# Patient Record
Sex: Male | Born: 1970 | Race: White | Hispanic: No | Marital: Married | Smoking: Never smoker
Health system: Southern US, Community
[De-identification: ages and names within clinical notes are randomized; demographics above are authoritative.]

## PROBLEM LIST (undated history)

## (undated) HISTORY — PX: THYROID SURGERY: SHX805

---

## 2013-12-08 DIAGNOSIS — J309 Allergic rhinitis, unspecified: Secondary | ICD-10-CM | POA: Insufficient documentation

## 2015-09-28 ENCOUNTER — Ambulatory Visit
Admission: EM | Admit: 2015-09-28 | Discharge: 2015-09-28 | Disposition: A | Discharge status: Home / Self Care | Payer: BLUE CROSS/BLUE SHIELD | Attending: Family Medicine | Admitting: Family Medicine

## 2015-09-28 ENCOUNTER — Encounter: Payer: Self-pay | Admitting: Emergency Medicine

## 2015-09-28 DIAGNOSIS — R21 Rash and other nonspecific skin eruption: Secondary | ICD-10-CM

## 2015-09-28 MED ORDER — CETIRIZINE HCL 10 MG PO TABS: 10.0000 mg | ORAL_TABLET | Freq: Every day | ORAL | Status: DC

## 2015-09-28 MED ORDER — RANITIDINE HCL 150 MG PO CAPS: 150.0000 mg | ORAL_CAPSULE | Freq: Two times a day (BID) | ORAL | Status: DC | PRN

## 2015-09-28 MED ORDER — PREDNISONE 10 MG (21) PO TBPK: ORAL_TABLET | ORAL | Status: DC

## 2015-09-28 MED ORDER — LORATADINE 10 MG PO TABS: 10.0000 mg | ORAL_TABLET | Freq: Every day | ORAL | Status: DC

## 2015-09-28 MED ORDER — METHYLPREDNISOLONE SODIUM SUCC 125 MG IJ SOLR
125.0000 mg | Freq: Once | INTRAMUSCULAR | Status: AC
  Administered 2015-09-28: 125 mg via INTRAMUSCULAR

## 2015-09-28 NOTE — Discharge Instructions (Signed)
Contact Dermatitis Dermatitis is redness, soreness, and swelling (inflammation) of the skin. Contact dermatitis is a reaction to certain substances that touch the skin. You either touched something that irritated your skin, or you have allergies to something you touched.  HOME CARE  Skin Care  Moisturize your skin as needed.  Apply cool compresses to the affected areas.   Try taking a bath with:   Epsom salts. Follow the instructions on the package. You can get these at a pharmacy or grocery store.   Baking soda. Pour a small amount into the bath as told by your doctor.   Colloidal oatmeal. Follow the instructions on the package. You can get this at a pharmacy or grocery store.   Try applying baking soda paste to your skin. Stir water into baking soda until it looks like paste.  Do not scratch your skin.   Bathe less often.  Bathe in lukewarm water. Avoid using hot water.  Medicines  Take or apply over-the-counter and prescription medicines only as told by your doctor.   If you were prescribed an antibiotic medicine, take or apply your antibiotic as told by your doctor. Do not stop taking the antibiotic even if your condition starts to get better. General Instructions  Keep all follow-up visits as told by your doctor. This is important.   Avoid the substance that caused your reaction. If you do not know what caused it, keep a journal to try to track what caused it. Write down:   What you eat.   What cosmetic products you use.   What you drink.   What you wear in the affected area. This includes jewelry.   If you were given a bandage (dressing), take care of it as told by your doctor. This includes when to change and remove it.  GET HELP IF:   You do not get better with treatment.   Your condition gets worse.   You have signs of infection such as:  Swelling.  Tenderness.  Redness.  Soreness.  Warmth.   You have a fever.   You have new  symptoms.  GET HELP RIGHT AWAY IF:   You have a very bad headache.  You have neck pain.  Your neck is stiff.   You throw up (vomit).   You feel very sleepy.   You see red streaks coming from the affected area.   Your bone or joint underneath the affected area becomes painful after the skin has healed.   The affected area turns darker.   You have trouble breathing.    This information is not intended to replace advice given to you by your health care provider. Make sure you discuss any questions you have with your health care provider.   Document Released: 01/13/2009 Document Revised: 12/07/2014 Document Reviewed: 08/03/2014 Elsevier Interactive Patient Education 2016 Elsevier Inc. Rash A rash is a change in the color or texture of the skin. There are many different types of rashes. You may have other problems that accompany your rash. CAUSES   Infections.  Allergic reactions. This can include allergies to pets or foods.  Certain medicines.  Exposure to certain chemicals, soaps, or cosmetics.  Heat.  Exposure to poisonous plants.  Tumors, both cancerous and noncancerous. SYMPTOMS   Redness.  Scaly skin.  Itchy skin.  Dry or cracked skin.  Bumps.  Blisters.  Pain. DIAGNOSIS  Your caregiver may do a physical exam to determine what type of rash you have. A skin sample (biopsy)  may be taken and examined under a microscope. TREATMENT  Treatment depends on the type of rash you have. Your caregiver may prescribe certain medicines. For serious conditions, you may need to see a skin doctor (dermatologist). HOME CARE INSTRUCTIONS   Avoid the substance that caused your rash.  Do not scratch your rash. This can cause infection.  You may take cool baths to help stop itching.  Only take over-the-counter or prescription medicines as directed by your caregiver.  Keep all follow-up appointments as directed by your caregiver. SEEK IMMEDIATE MEDICAL CARE  IF:  You have increasing pain, swelling, or redness.  You have a fever.  You have new or severe symptoms.  You have body aches, diarrhea, or vomiting.  Your rash is not better after 3 days. MAKE SURE YOU:  Understand these instructions.  Will watch your condition.  Will get help right away if you are not doing well or get worse.   This information is not intended to replace advice given to you by your health care provider. Make sure you discuss any questions you have with your health care provider.   Document Released: 03/08/2002 Document Revised: 04/08/2014 Document Reviewed: 08/03/2014 Elsevier Interactive Patient Education [employer] Elsevier Inc.

## 2015-09-28 NOTE — ED Provider Notes (Signed)
CSN: [id number]     Arrival date & time 09/28/15  0805 History   First MD Initiated Contact with Patient 09/28/15 [phone]    Nurses notes were reviewed. Chief Complaint  Patient presents with  . Rash   Patient with a rash over most of his body. States started in his legs [facility] does not know how the rash started. He denies any exposure to any vegetation states [place] leg. He states that this itches but he has not scratched his legs but he has her discretion back last night. Started lower part of his legs and spread from the lower part of his legs upper thighs and his back as well. He's had poison ivy and poison oak before. He went to the control clinic on Tuesday since it started on Monday was brought her cortisone cream which does not help.  He does not smoke he's had thyroid surgery but denies any other medical problems. No pertinent family medical history pertinent to today's visit.  (Consider location/radiation/quality/duration/timing/severity/associated sxs/prior Treatment) Patient is a 45 y.o. male presenting with rash. The history is provided by the patient. No language interpreter was used.  Rash Location:  Full body Quality: itchiness and redness   Severity:  Moderate Duration:  5 days Timing:  Constant Progression:  Worsening Chronicity:  New Context: not animal contact, not chemical exposure, not plant contact and not sun exposure   Relieved by:  Nothing Ineffective treatments:  Topical steroids Associated symptoms: no abdominal pain, no fatigue, no shortness of breath, no sore throat, no throat swelling and not wheezing     History reviewed. No pertinent past medical history. Past Surgical History  Procedure Laterality Date  . Thyroid surgery     History reviewed. No pertinent family history. Social History  Substance Use Topics  . Smoking status: Never Smoker   . Smokeless tobacco: Never Used  . Alcohol Use: Yes    Review of Systems    Constitutional: Negative for fatigue.  HENT: Positive for mouth sores. Negative for facial swelling, sinus pressure, sneezing and sore throat.   Respiratory: Negative for choking, shortness of breath and wheezing.   Gastrointestinal: Negative for abdominal pain.  Skin: Positive for rash.  All other systems reviewed and are negative.   Allergies  Review of patient's allergies indicates no known allergies.  Home Medications   Prior to Admission medications   Medication Sig Start Date End Date Taking? Authorizing Provider  Multiple Vitamin (MULTIVITAMIN) tablet Take 1 tablet by mouth daily.   Yes Historical Provider, MD  omega-3 acid ethyl esters (LOVAZA) 1 g capsule Take 1 g by mouth daily.   Yes Historical Provider, MD  cetirizine (ZYRTEC) 10 MG tablet Take 1 tablet (10 mg total) by mouth daily. If needed at night for itching not relieved by Claritin in the morning. 09/28/15   Hassan RowanEugene Terrance Lanahan, MD  loratadine (CLARITIN) 10 MG tablet Take 1 tablet (10 mg total) by mouth daily. Take 1 tablet in the morning. As needed for itching. 09/28/15   Hassan RowanEugene Ariza Evans, MD  predniSONE (STERAPRED UNI-PAK 21 TAB) 10 MG (21) TBPK tablet 6 tabs day 1 and 2, 5 tabs day 3 and 4, 4 tabs day 5 and 6, 3 tabs day 7 and 8, 2 tabs day 9 and 10, 1 tab day 11 and 12. Take orally 09/28/15   Hassan RowanEugene Alexyss Balzarini, MD  ranitidine (ZANTAC) 150 MG capsule Take 1 capsule (150 mg total) by mouth 2 (two) times daily as  needed for heartburn. 09/28/15   Hassan RowanEugene Chandra Feger, MD   Meds Ordered and Administered this Visit   Medications  methylPREDNISolone sodium succinate (SOLU-MEDROL) 125 mg/2 mL injection 125 mg (125 mg Intramuscular Given 09/28/15 0849)    BP 108/69 mmHg  Pulse 42  Temp(Src) 97.7 F (36.5 C) (Oral)  Resp 16  Ht 6\' 1"  (1.854 m)  Wt 170 lb (77.111 kg)  BMI 22.43 kg/m2  SpO2 100% No data found.   Physical Exam  Constitutional: He is oriented to person, place, and time. He appears well-developed and well-nourished.  HENT:  Head:  Normocephalic and atraumatic.  Eyes: Conjunctivae are normal. Pupils are equal, round, and reactive to light.  Neck: Normal range of motion.  Pulmonary/Chest: Effort normal.  Musculoskeletal: Normal range of motion.  Neurological: He is alert and oriented to person, place, and time.  Skin: Rash noted.     He states this is basically gone from both lower extremities up into his upper back. Reports this also includes his buttocks which I did not see today. While he states is only been scratching his back the entire rash appears to be a linear type fashion as if he's had excoriations on his arms and his legs due to scratching. This rash is almost a vasculitis and that there is blanching overweight he's been scratching so much.  Psychiatric: He has a normal mood and affect.  Vitals reviewed.   ED Course  Procedures (including critical care time)  Labs Review Labs Reviewed - No data to display  Imaging Review No results found.   Visual Acuity Review  Right Eye Distance:   Left Eye Distance:   Bilateral Distance:    Right Eye Near:   Left Eye Near:    Bilateral Near:         MDM   1. Rash of unknown cause   2. Rash and nonspecific skin eruption    Not clear exactly with the etiology of this rash. The Jewel BaizeCraven be a symptom of a contact dermatitis some type of allergic reaction and be very atypical for scabies as well with amount of redness and not seeing any of the burrows typical for scabies. Will treat aggressively for some type of contact dermatitis will give an injection of Solu-Medrol 125 total days of prednisone as Claritin 10 mg daily and Zantac 150 twice a day 1 if the itching persist may need him 10 mg Zyrtec at night. Work note given for today expect with his 2419 better he may need to see a dermatologist for possible skin biopsy.     Note: This dictation was prepared with Dragon dictation along with smaller phrase technology. Any transcriptional errors that result from  this process are unintentional.    Hassan RowanEugene Aliscia Clayton, MD 09/28/15 [id number]

## 2015-09-28 NOTE — ED Notes (Signed)
Patient c/o itchy rash that started on Monday on his legs and now has started to spread to his thigh and back.  Patient [facility] and was given a cream.

## 2015-10-20 ENCOUNTER — Ambulatory Visit
Admission: RE | Admit: 2015-10-20 | Discharge: 2015-10-20 | Disposition: A | Discharge status: Home / Self Care | Payer: BLUE CROSS/BLUE SHIELD | Source: Ambulatory Visit | Attending: Family Medicine | Admitting: Family Medicine

## 2015-10-20 ENCOUNTER — Other Ambulatory Visit: Payer: Self-pay | Admitting: Family Medicine

## 2015-10-20 DIAGNOSIS — N433 Hydrocele, unspecified: Secondary | ICD-10-CM | POA: Diagnosis not present

## 2015-10-20 DIAGNOSIS — N50812 Left testicular pain: Secondary | ICD-10-CM

## 2018-11-24 DIAGNOSIS — R1031 Right lower quadrant pain: Secondary | ICD-10-CM | POA: Insufficient documentation

## 2019-01-07 ENCOUNTER — Other Ambulatory Visit: Payer: Self-pay | Admitting: Surgery

## 2019-01-07 DIAGNOSIS — R1031 Right lower quadrant pain: Secondary | ICD-10-CM

## 2019-01-19 ENCOUNTER — Other Ambulatory Visit: Payer: Self-pay

## 2019-01-19 ENCOUNTER — Ambulatory Visit
Admission: RE | Admit: 2019-01-19 | Discharge: 2019-01-19 | Disposition: A | Discharge status: Home / Self Care | Payer: BC Managed Care – PPO | Source: Ambulatory Visit | Attending: Surgery | Admitting: Surgery

## 2019-01-19 DIAGNOSIS — R1031 Right lower quadrant pain: Secondary | ICD-10-CM | POA: Insufficient documentation

## 2019-01-19 MED ORDER — GADOBUTROL 1 MMOL/ML IV SOLN
7.0000 mL | Freq: Once | INTRAVENOUS | Status: AC | PRN
  Administered 2019-01-19: 7 mL via INTRAVENOUS

## 2020-07-08 IMAGING — MR MR PELVIS WO/W CM
12 of 13 series · 43 of 48 positions shown · IV contrast (gadavist)
Comparison: None.

CLINICAL DATA: Palpable right groin mass x2 months, possible injury
during rope course

EXAM:
MRI PELVIS WITHOUT AND WITH CONTRAST
TECHNIQUE: Multiplanar multisequence MR imaging of the pelvis was performed
both before and after administration of intravenous contrast.
CONTRAST:  7mL GADAVIST GADOBUTROL 1 MMOL/ML IV SOLN

[Series 2: T2 · coronal · 5.0mm · 1.41mm/px · 5 of 32 slices shown]
[im 1/32]
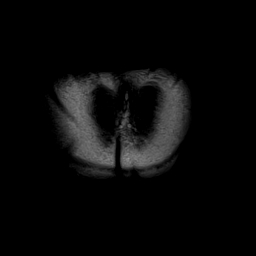
[im 8/32]
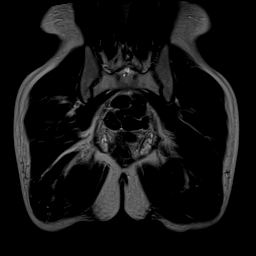
[im 16/32]
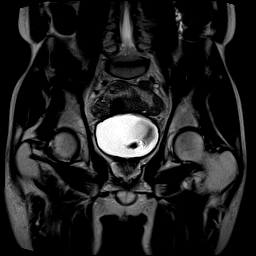
[im 24/32]
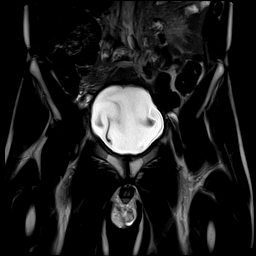
[im 32/32]
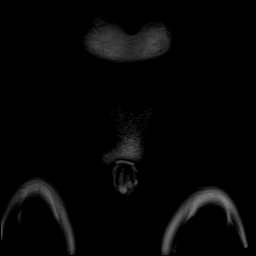

[Series 3: STIR · coronal · 4.0mm · 1.04mm/px · 5 of 40 slices shown (1 of 2)]
[im 1/40]
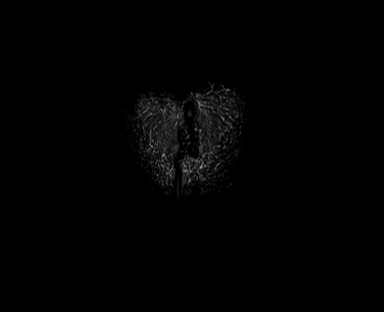
[im 10/40]
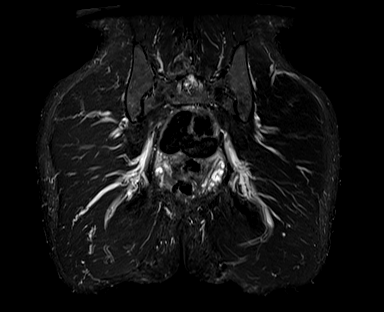
[im 20/40]
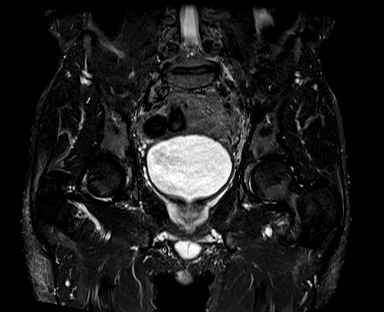
[im 30/40]
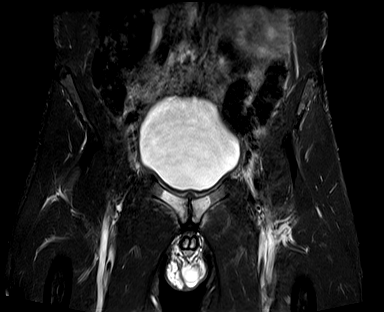
[im 40/40]
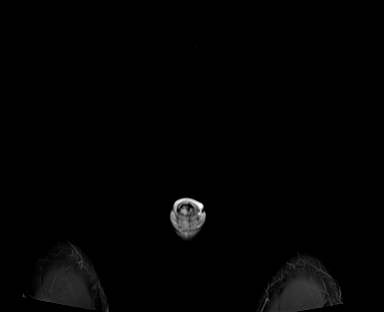

[Series 4: T1 · coronal · 4.0mm · 1.04mm/px · 5 of 40 slices shown (1 of 2)]
[im 1/40]
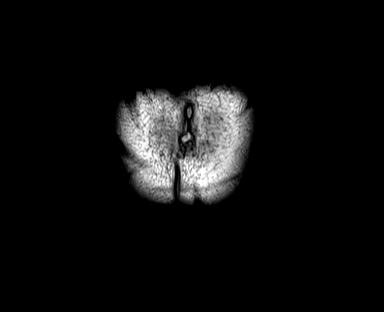
[im 10/40]
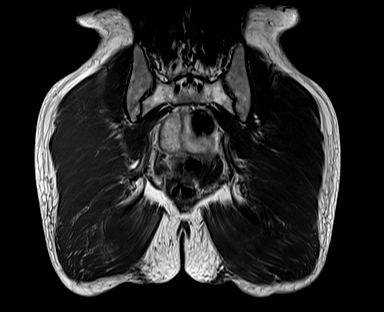
[im 20/40]
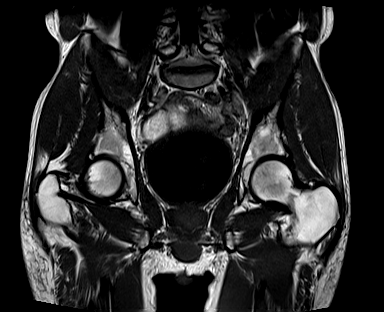
[im 30/40]
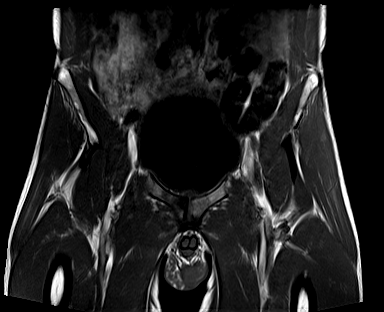
[im 40/40]
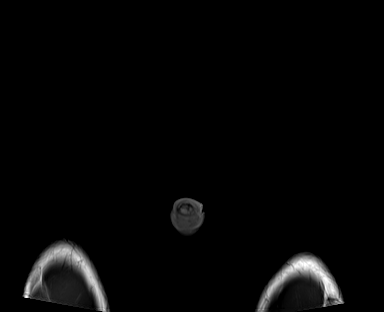

[Series 5: T2 fat-sat · axial · 4.0mm · 0.68mm/px · z∈[-142,+103]mm · 6 of 50 slices shown (1 of 2)]
[im 1/50]
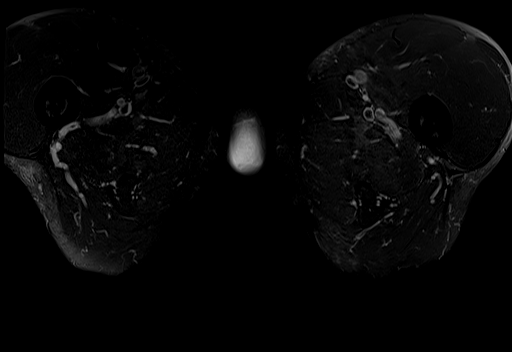
[im 10/50]
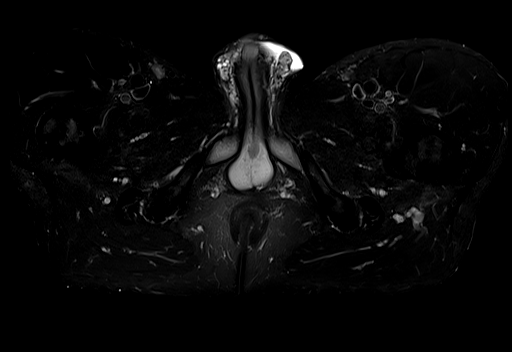
[im 20/50]
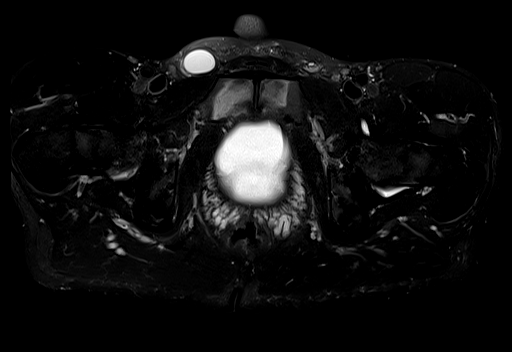
[im 30/50]
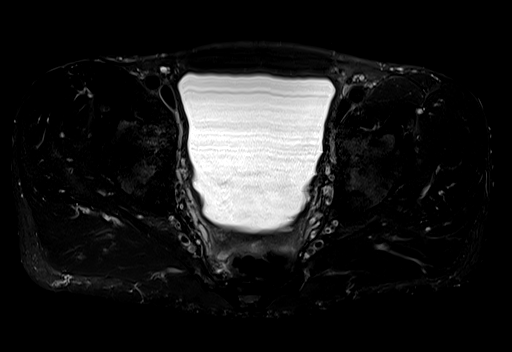
[im 40/50]
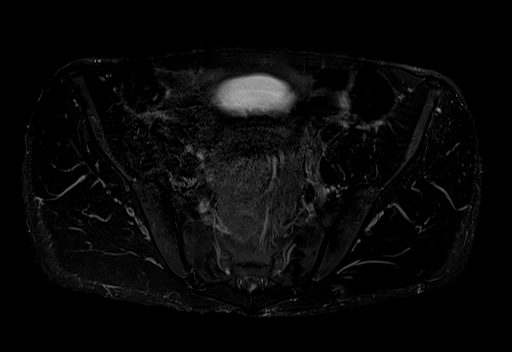
[im 50/50]
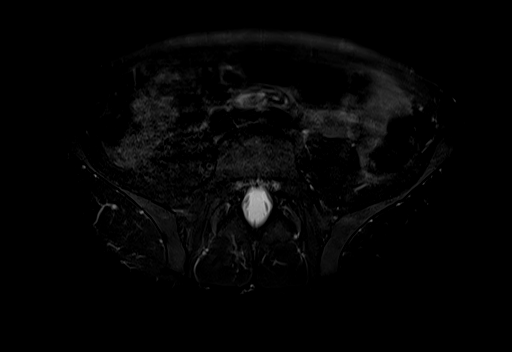

[Series 6: PD · axial · 4.0mm · 0.62mm/px · z∈[-109,+21]mm · 3 of 30 slices shown]
[im 1/30]
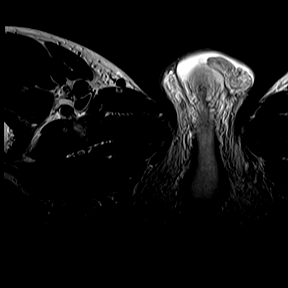
[im 15/30]
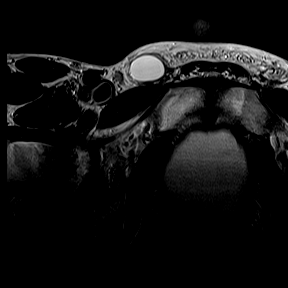
[im 30/30]
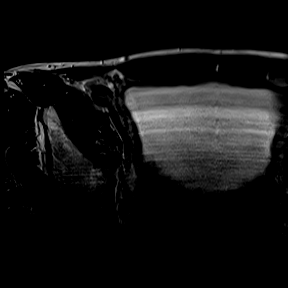

[Series 9: [id number] · axial · 4.0mm · 0.40mm/px · z∈[-114,+31]mm · 3 of 30 slices shown]
[im 1/30]
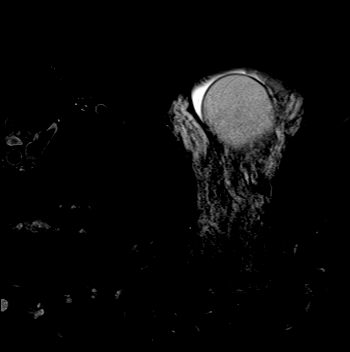
[im 15/30]
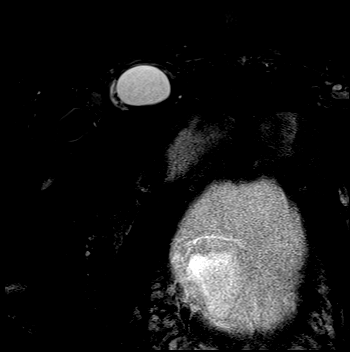
[im 30/30]
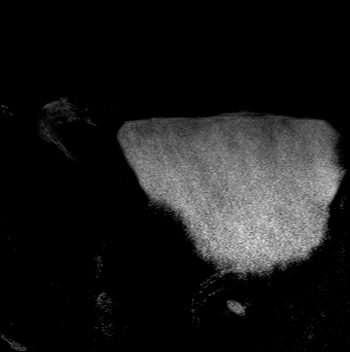

[Series 10: T1 · axial · 4.0mm · 0.44mm/px · z∈[-114,+31]mm · 3 of 30 slices shown (2 of 2)]
[im 1/30]
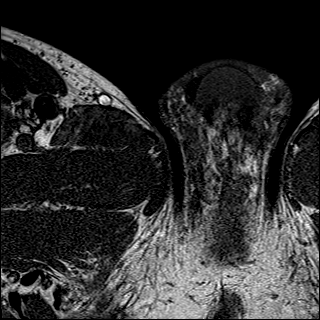
[im 15/30]
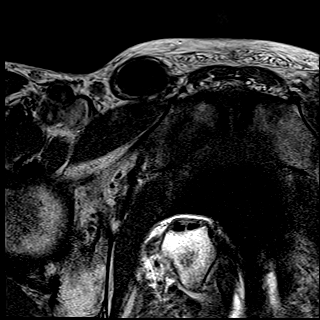
[im 30/30]
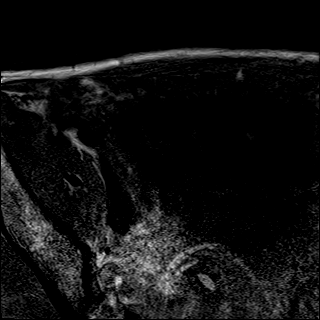

[Series 11: T1 fat-sat · axial · 4.0mm · 0.44mm/px · z∈[-114,+31]mm · 3 of 30 slices shown]
[im 1/30]
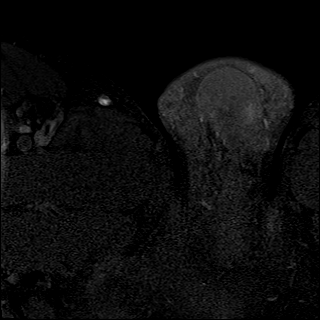
[im 15/30]
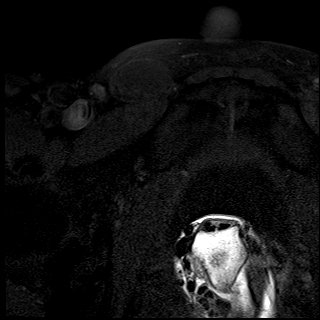
[im 30/30]
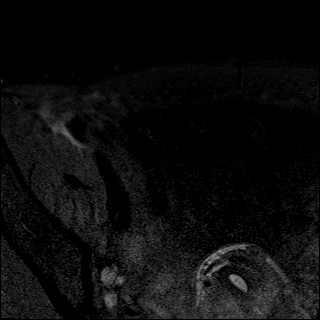

[Series 12: T2 fat-sat · coronal · 4.0mm · 0.46mm/px · 3 of 30 slices shown (2 of 2)]
[im 1/30]
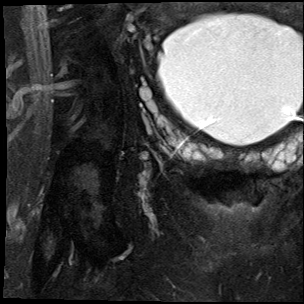
[im 15/30]
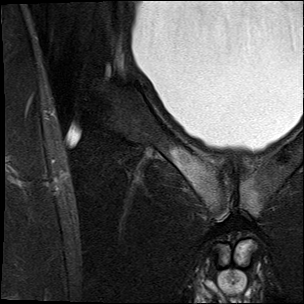
[im 30/30]
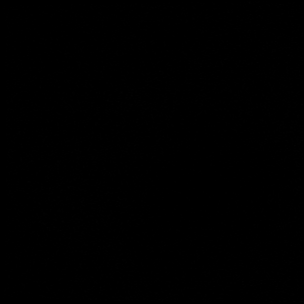

[Series 13: STIR · sagittal · 4.0mm · 0.49mm/px · 3 of 31 slices shown (2 of 2)]
[im 1/31]
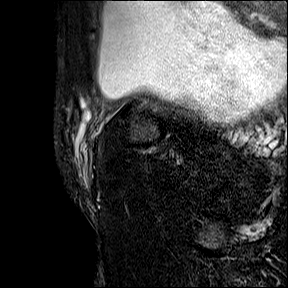
[im 16/31]
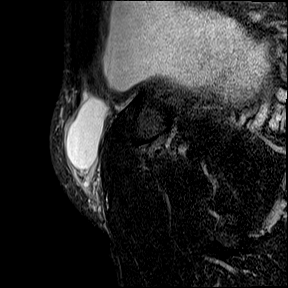
[im 31/31]
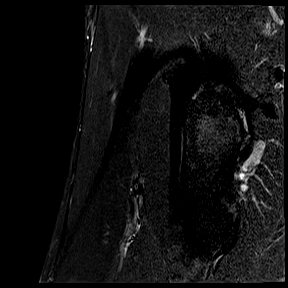

[Series 14: T1 fat-sat post-contrast · axial · 4.0mm · 0.44mm/px · z∈[-114,+31]mm · 3 of 30 slices shown (1 of 2)]
[im 1/30]
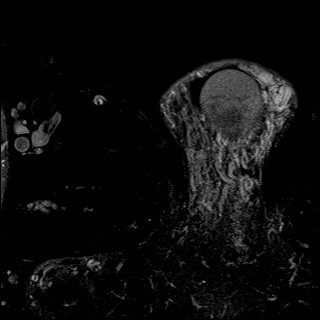
[im 15/30]
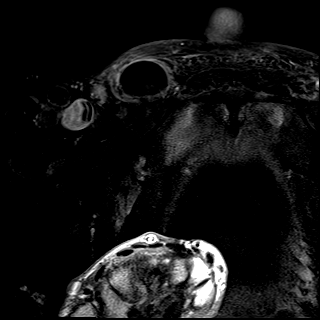
[im 30/30]
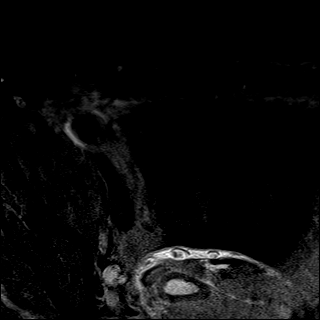

[Series 15: T1 fat-sat post-contrast · sagittal · 4.0mm · 0.55mm/px · 1 of 31 slices shown (2 of 2)]
[im 1/31]
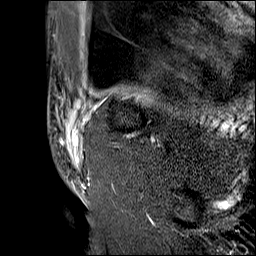

[43 of 48 positions shown; findings below may reference images not displayed]

FINDINGS: Urinary Tract:  Bladder is within normal limits.

Bowel:  Visualized bowel is unremarkable.

Vascular/Lymphatic: No evidence of aneurysm.

No suspicious pelvic lymphadenopathy.

Reproductive:  Prostate is unremarkable.

Small left hydrocele.

Other: Small fat/fluid containing direct right inguinal hernia.
Dominant 1.7 x 2.1 cm fluid collection inferomedially (series
9/image 15). Additional 10 x 14 mm fluid collection superolaterally
(series 9/image 11).

No evidence of bowel within the hernia.

Musculoskeletal: No focal osseous lesions. Mildly heterogeneous
marrow signal.
IMPRESSION: Small fat/fluid containing direct right inguinal hernia,
corresponding to the clinical abnormality, as above.

## 2021-04-09 ENCOUNTER — Other Ambulatory Visit: Payer: Self-pay

## 2021-04-09 DIAGNOSIS — Z1211 Encounter for screening for malignant neoplasm of colon: Secondary | ICD-10-CM

## 2021-04-09 MED ORDER — PEG 3350-KCL-NA BICARB-NACL 420 G PO SOLR: 4000.0000 mL | Freq: Once | ORAL | 0 refills | Status: AC

## 2021-04-09 NOTE — Progress Notes (Signed)
Gastroenterology Pre-Procedure Review  Request Date: 05/23/2021 Requesting Physician: Dr. Vicente Males  PATIENT REVIEW QUESTIONS: The patient responded to the following health history questions as indicated:    1. Are you having any GI issues? no 2. Do you have a personal history of Polyps? no 3. Do you have a family history of Colon Cancer or Polyps? yes (MOM POLYPS) 4. Diabetes Mellitus? no 5. Joint replacements in the past 12 months?no 6. Major health problems in the past 3 months?no 7. Any artificial heart valves, MVP, or defibrillator?no    MEDICATIONS & ALLERGIES:    Patient reports the following regarding taking any anticoagulation/antiplatelet therapy:   Plavix, Coumadin, Eliquis, Xarelto, Lovenox, Pradaxa, Brilinta, or Effient? no Aspirin? no  Patient confirms/reports the following medications:  Current Outpatient Medications  Medication Sig Dispense Refill   cetirizine (ZYRTEC) 10 MG tablet Take 1 tablet (10 mg total) by mouth daily. If needed at night for itching not relieved by Claritin in the morning. 30 tablet 0   loratadine (CLARITIN) 10 MG tablet Take 1 tablet (10 mg total) by mouth daily. Take 1 tablet in the morning. As needed for itching. 30 tablet 0   Multiple Vitamin (MULTIVITAMIN) tablet Take 1 tablet by mouth daily.     omega-3 acid ethyl esters (LOVAZA) 1 g capsule Take 1 g by mouth daily.     predniSONE (STERAPRED UNI-PAK 21 TAB) 10 MG (21) TBPK tablet 6 tabs day 1 and 2, 5 tabs day 3 and 4, 4 tabs day 5 and 6, 3 tabs day 7 and 8, 2 tabs day 9 and 10, 1 tab day 11 and 12. Take orally 42 tablet 0   ranitidine (ZANTAC) 150 MG capsule Take 1 capsule (150 mg total) by mouth 2 (two) times daily as needed for heartburn. 30 capsule 0   No current facility-administered medications for this visit.    Patient confirms/reports the following allergies:  No Known Allergies  No orders of the defined types were placed in this encounter.   AUTHORIZATION INFORMATION Primary  Insurance: 1D#: Group #:  Secondary Insurance: 1D#: Group #:  SCHEDULE INFORMATION: Date:  05/23/2021 Time: Location: [facility]

## 2021-05-07 ENCOUNTER — Telehealth: Payer: Self-pay

## 2021-05-07 NOTE — Telephone Encounter (Signed)
PATIENT CALLED AND CANCELED WANTS TO USE A DIFFERENT PROVIDER FOR PROCEDURE CALLED ENDO AND CANCELED

## 2021-05-23 ENCOUNTER — Ambulatory Visit: Admit: 2021-05-23 | Payer: BC Managed Care – PPO | Admitting: Gastroenterology

## 2021-05-23 SURGERY — COLONOSCOPY WITH PROPOFOL
Anesthesia: General

## 2022-04-10 DIAGNOSIS — M1812 Unilateral primary osteoarthritis of first carpometacarpal joint, left hand: Secondary | ICD-10-CM | POA: Insufficient documentation

## 2022-11-18 ENCOUNTER — Ambulatory Visit
Admission: RE | Admit: 2022-11-18 | Discharge: 2022-11-18 | Disposition: A | Discharge status: Home / Self Care | Payer: BC Managed Care – PPO | Source: Ambulatory Visit | Attending: Family Medicine | Admitting: Family Medicine

## 2022-11-18 VITALS — BP 134/80 | HR 51 | Temp 98.6°F | Resp 16

## 2022-11-18 DIAGNOSIS — S0502XA Injury of conjunctiva and corneal abrasion without foreign body, left eye, initial encounter: Secondary | ICD-10-CM | POA: Diagnosis not present

## 2022-11-18 MED ORDER — ERYTHROMYCIN 5 MG/GM OP OINT: TOPICAL_OINTMENT | OPHTHALMIC | 0 refills | Status: DC

## 2022-11-18 NOTE — ED Provider Notes (Signed)
[facility]    CSN: [id number] Arrival date & time: 11/18/22  1443      History   Chief Complaint Chief Complaint  Patient presents with   Eye Problem    Eyelid pain since Saturday and swelling under eye - Entered by patient    HPI HPI  Montell Winget is a 52 y.o. male.    Hartwell presents for left eye pain that started Saturday.  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 States he thought he got an eyelash in his eye. No treatment prior to arrival.  Maden feels like something is still in his eye but does not see anything.   Francisco does wear contacts but has been wearing his glasses instead.  Ryanjoseph has not had any trouble seeing or discharge from the eye.  However, eye pain remains.  Sharn has otherwise been well and has no additional concerns today.    History reviewed. No pertinent past medical history.  There are no problems to display for this patient.   Past Surgical History:  Procedure Laterality Date   THYROID SURGERY         Home Medications    Prior to Admission medications   Medication Sig Start Date End Date Taking? Authorizing Provider  erythromycin ophthalmic ointment Place a 1/2 inch ribbon of ointment into the lower eyelid 3 times a day for 7 days. 11/18/22  Yes Clementine Soulliere, DO  cetirizine (ZYRTEC) 10 MG tablet Take 1 tablet (10 mg total) by mouth daily. If needed at night for itching not relieved by Claritin in the morning. 09/28/15   Hassan Rowan, MD  loratadine (CLARITIN) 10 MG tablet Take 1 tablet (10 mg total) by mouth daily. Take 1 tablet in the morning. As needed for itching. 09/28/15   Hassan Rowan, MD  Multiple Vitamin (MULTIVITAMIN) tablet Take 1 tablet by mouth daily.    [provider]  omega-3 acid ethyl esters (LOVAZA) 1 g capsule Take 1 g by mouth daily.    [provider]  predniSONE (STERAPRED UNI-PAK 21 TAB) 10 MG (21) TBPK tablet 6 tabs day 1 and 2, 5  tabs day 3 and 4, 4 tabs day 5 and 6, 3 tabs day 7 and 8, 2 tabs day 9 and 10, 1 tab day 11 and 12. Take orally 09/28/15   Hassan Rowan, MD  ranitidine (ZANTAC) 150 MG capsule Take 1 capsule (150 mg total) by mouth 2 (two) times daily as needed for heartburn. 09/28/15   Hassan Rowan, MD    Family History History reviewed. No pertinent family history.  Social History Social History   Tobacco Use   Smoking status: Never   Smokeless tobacco: Never  Vaping Use   Vaping status: Never Used  Substance Use Topics   Alcohol use: Yes   Drug use: No     Allergies   Patient has no known allergies.   Review of Systems Review of Systems : negative unless otherwise stated in HPI.      Physical Exam Triage Vital Signs ED Triage Vitals  Encounter Vitals Group     BP 11/18/22 1532 134/80     Systolic BP Percentile --      Diastolic BP Percentile --      Pulse Rate 11/18/22 1532 (!) 51     Resp 11/18/22 1532 16     Temp 11/18/22 1532 98.6 F (37 C)     Temp Source 11/18/22  1532 Oral     SpO2 11/18/22 1532 98 %     Weight --      Height --      Head Circumference --      Peak Flow --      Pain Score 11/18/22 1531 4     Pain Loc --      Pain Education --      Exclude from Growth Chart --    No data found.  Updated Vital Signs BP 134/80 (BP Location: Right Arm)   Pulse (!) 51   Temp 98.6 F (37 C) (Oral)   Resp 16   SpO2 98%   Visual Acuity Right Eye Distance:   Left Eye Distance:   Bilateral Distance:    Right Eye Near:   Left Eye Near:    Bilateral Near:     Physical Exam  GEN: pleasant well appearing male, in no acute distress  NECK: normal ROM  CV: regular rate  RESP: no increased work of breathing EYES:     General: Lids are everted, no foreign bodies appreciated. Vision grossly intact. Gaze aligned appropriately.        Right eye: No discharge, foreign body or hordeolum.  Mildly injected. Lower lid erythema.      Left eye: No foreign body, discharge or  hordeolum.     Extraocular Movements: Extraocular movements intact.    PERRLA     Conjunctiva/sclera:  No chemosis or hemorrhage.    Comments: fluorescein stain performed, Corneal abrasions in the 3-6  o'clock position, rinsed and inspected for foreign bodies  SKIN: warm and dry   UC Treatments / Results  Labs (all labs ordered are listed, but only abnormal results are displayed) Labs Reviewed - No data to display  EKG   Radiology No results found.  Procedures Procedures (including critical care time)  Medications Ordered in UC Medications - No data to display  Initial Impression / Assessment and Plan / UC Course  I have reviewed the triage vital signs and the nursing notes.  Pertinent labs & imaging results that were available during my care of the patient were reviewed by me and considered in my medical decision making (see chart for details).     Patient is a 52 y.o. male who presents after right eye pain for the past 2 days.  On exam, he has a evidence of conjunctivitis on the right. Treat with erythromycin ointment.  Advised to follow-up with an ophthalmologist or optometrist, if  discomfort/pain is not improving after 7day course. Recommended pt pick up eye patch from the pharmacy, if desired. Understanding voiced.   Discussed MDM, treatment plan and plan for follow-up with patient who agrees with plan.  Final Clinical Impressions(s) / UC Diagnoses   Final diagnoses:  Abrasion of left cornea, initial encounter     Discharge Instructions      Stop by the pharmacy to pick up your antibiotic eye medication.  See handout on corneal abrasion. Follow up with your primary eyecare provider or [facility] if symptoms suddenly worsen or you have little improvement in your eye symptoms.       ED Prescriptions     Medication Sig Dispense Auth. Provider   erythromycin ophthalmic ointment Place a 1/2 inch ribbon of ointment into the lower eyelid 3 times a day for  7 days. 3.5 g Katha Cabal, DO      PDMP not reviewed this encounter.   Katha Cabal, DO 11/18/22 1609

## 2022-11-18 NOTE — ED Triage Notes (Signed)
Pt presents with left eye pain x 2 days. Pt states it originally felt like he had something in his eye. Then he developed eye lid pain.

## 2022-11-18 NOTE — Discharge Instructions (Signed)
Stop by the pharmacy to pick up your antibiotic eye medication.  See handout on corneal abrasion. Follow up with your primary eyecare provider or [facility] if symptoms suddenly worsen or you have little improvement in your eye symptoms.

## 2023-05-05 ENCOUNTER — Encounter: Payer: Self-pay | Admitting: Pediatrics

## 2023-05-26 ENCOUNTER — Ambulatory Visit (AMBULATORY_SURGERY_CENTER): Payer: BC Managed Care – PPO

## 2023-05-26 VITALS — Ht 73.0 in | Wt 180.0 lb

## 2023-05-26 DIAGNOSIS — Z1211 Encounter for screening for malignant neoplasm of colon: Secondary | ICD-10-CM

## 2023-05-26 NOTE — Progress Notes (Signed)
 No egg or soy allergy known to patient  No issues known to pt with past sedation with any surgeries or procedures Patient denies ever being told they had issues or difficulty with intubation  No FH of Malignant Hyperthermia Pt is not on diet pills Pt is not on  home 02  Pt is not on blood thinners  Pt denies issues with constipation  No A fib or A flutter Have any cardiac testing pending--no LOA: independent  Prep: 2 day Golytely pt already has prep   Patient's chart reviewed by Cathlyn Parsons CNRA prior to previsit and patient appropriate for the [facility].  Previsit completed and red dot placed by patient's name on their procedure day (on provider's schedule).     PV completed with patient. Prep instructions sent via mychart and home address.

## 2023-06-05 ENCOUNTER — Encounter: Payer: Self-pay | Admitting: Pediatrics

## 2023-06-10 NOTE — Progress Notes (Unsigned)
 [facility] History and Physical   Primary Care Physician:  Marina Goodell, MD   Reason for Procedure:  Colon cancer screening  Plan:    Screening colonoscopy  HPI: Austin Boyer is a 53 y.o. male undergoing screening colonoscopy for colon cancer screening.  Records indicate that the patient had difficulty tolerating bowel prep in 2023 and therefore colonoscopy was deferred.  No subsequent attempted colonoscopy documented.  No family history of colorectal cancer.  Currently denies change in bowel habits or rectal bleeding.   No past medical history on file.  Past Surgical History:  Procedure Laterality Date   THYROID SURGERY      Prior to Admission medications   Medication Sig Start Date End Date Taking? Authorizing Provider  Cholecalciferol (VITAMIN D3) 50 MCG (2000 UT) capsule Take 2,000 Units by mouth daily. 04/23/21   [provider]  Ferrous Sulfate (IRON) 325 (65 Fe) MG TABS Take 1 tablet by mouth daily. 04/23/21   [provider]  Multiple Vitamin (MULTIVITAMIN) tablet Take 1 tablet by mouth daily.    [provider]  omega-3 acid ethyl esters (LOVAZA) 1 g capsule Take 1 g by mouth daily.    [provider]    Current Outpatient Medications  Medication Sig Dispense Refill   Cholecalciferol (VITAMIN D3) 50 MCG (2000 UT) capsule Take 2,000 Units by mouth daily.     Ferrous Sulfate (IRON) 325 (65 Fe) MG TABS Take 1 tablet by mouth daily.     Multiple Vitamin (MULTIVITAMIN) tablet Take 1 tablet by mouth daily.     omega-3 acid ethyl esters (LOVAZA) 1 g capsule Take 1 g by mouth daily.     No current facility-administered medications for this visit.    Allergies as of 06/11/2023   (No Known Allergies)    Family History  Problem Relation Age of Onset   Stomach cancer Maternal Grandmother    Colon cancer Neg Hx    Rectal cancer Neg Hx     Social History   Socioeconomic History   Marital status: Married     Spouse name: Not on file   Number of children: Not on file   Years of education: Not on file   Highest education level: Not on file  Occupational History   Not on file  Tobacco Use   Smoking status: Never   Smokeless tobacco: Never  Vaping Use   Vaping status: Never Used  Substance and Sexual Activity   Alcohol use: Yes   Drug use: No   Sexual activity: Not on file  Other Topics Concern   Not on file  Social History Narrative   Not on file   Social Drivers of Health   Financial Resource Strain: Not on file  Food Insecurity: Not on file  Transportation Needs: Not on file  Physical Activity: Not on file  Stress: Not on file  Social Connections: Not on file  Intimate Partner Violence: Not on file    Review of Systems:  All other review of systems negative except as mentioned in the HPI.  Physical Exam: Vital signs There were no vitals taken for this visit.  General:   Alert,  Well-developed, well-nourished, pleasant and cooperative in NAD Airway:  Mallampati  Lungs:  Clear throughout to auscultation.   Heart:  Regular rate and rhythm; no murmurs, clicks, rubs,  or gallops. Abdomen:  Soft, nontender and nondistended. Normal bowel sounds.   Neuro/Psych:  Normal mood and affect. A and O x 3  Maren Beach, MD [facility]

## 2023-06-11 ENCOUNTER — Ambulatory Visit: Payer: BC Managed Care – PPO | Admitting: Pediatrics

## 2023-06-11 ENCOUNTER — Encounter: Payer: Self-pay | Admitting: Pediatrics

## 2023-06-11 VITALS — BP 138/82 | HR 44 | Temp 97.2°F | Resp 12 | Ht 73.0 in | Wt 180.0 lb

## 2023-06-11 DIAGNOSIS — Z1211 Encounter for screening for malignant neoplasm of colon: Secondary | ICD-10-CM | POA: Diagnosis present

## 2023-06-11 DIAGNOSIS — D122 Benign neoplasm of ascending colon: Secondary | ICD-10-CM | POA: Diagnosis not present

## 2023-06-11 DIAGNOSIS — K635 Polyp of colon: Secondary | ICD-10-CM | POA: Diagnosis not present

## 2023-06-11 MED ORDER — FLEET ENEMA RE ENEM
1.0000 | ENEMA | Freq: Once | RECTAL | Status: AC
  Administered 2023-06-11: 1 via RECTAL

## 2023-06-11 MED ORDER — SODIUM CHLORIDE 0.9 % IV SOLN: 500.0000 mL | Freq: Once | INTRAVENOUS | Status: DC

## 2023-06-11 NOTE — Progress Notes (Signed)
 Called to room to assist during endoscopic procedure.  Patient ID and intended procedure confirmed with present staff. Received instructions for my participation in the procedure from the performing physician.

## 2023-06-11 NOTE — Patient Instructions (Signed)

## 2023-06-11 NOTE — Progress Notes (Signed)
 Sedate, gd SR, tolerated procedure well, VSS, report to RN

## 2023-06-11 NOTE — Progress Notes (Signed)
 Pt's states no medical or surgical changes since previsit or office visit.

## 2023-06-11 NOTE — Op Note (Signed)
 [facility] Patient Name: Austin Boyer Procedure Date: 06/11/2023 9:53 AM MRN: [id number] Endoscopist: Maren Beach , MD, [id number] Age: 53 Referring MD:  Date of Birth: [birth year] Gender: Male Account #: [id number] Procedure:                Colonoscopy Indications:              Screening for colorectal malignant neoplasm, This                            is the patient's first colonoscopy Medicines:                Monitored Anesthesia Care Procedure:                Pre-Anesthesia Assessment:                           - Prior to the procedure, a History and Physical                            was performed, and patient medications and                            allergies were reviewed. The patient's tolerance of                            previous anesthesia was also reviewed. The risks                            and benefits of the procedure and the sedation                            options and risks were discussed with the patient.                            All questions were answered, and informed consent                            was obtained. Prior Anticoagulants: The patient has                            taken no anticoagulant or antiplatelet agents. ASA                            Grade Assessment: I - A normal, healthy patient.                            After reviewing the risks and benefits, the patient                            was deemed in satisfactory condition to undergo the                            procedure.  After obtaining informed consent, the colonoscope                            was passed under direct vision. Throughout the                            procedure, the patient's blood pressure, pulse, and                            oxygen saturations were monitored continuously. The                            Olympus Scope SN: [id number] was introduced through                            the anus and advanced to the the  cecum, identified                            by appendiceal orifice and ileocecal valve. The                            colonoscopy was performed without difficulty. The                            patient tolerated the procedure well. The quality                            of the bowel preparation was adequate. The                            ileocecal valve, appendiceal orifice, and rectum                            were photographed. Scope In: 10:16:33 AM Scope Out: 10:40:00 AM Scope Withdrawal Time: 0 hours 11 minutes 47 seconds  Total Procedure Duration: 0 hours 23 minutes 27 seconds  Findings:                 The perianal and digital rectal examinations were                            normal. Pertinent negatives include normal                            sphincter tone and no palpable rectal lesions.                           A 6 mm polyp was found in the ascending colon. The                            polyp was sessile. The polyp was removed with a                            cold snare. Resection and retrieval were complete.  The retroflexed view of the distal rectum and anal                            verge was normal and showed no anal or rectal                            abnormalities. Complications:            No immediate complications. Estimated blood loss:                            Minimal. Estimated Blood Loss:     Estimated blood loss was minimal. Impression:               - One 6 mm polyp in the ascending colon, removed                            with a cold snare. Resected and retrieved.                           - The distal rectum and anal verge are normal on                            retroflexion view. Recommendation:           - Discharge patient to home (ambulatory).                           - Await pathology results.                           - Repeat colonoscopy for surveillance based on                            pathology results.                            - The findings and recommendations were discussed                            with the patient's family.                           - Return to referring physician.                           - Patient has a contact number available for                            emergencies. The signs and symptoms of potential                            delayed complications were discussed with the                            patient. Return to normal activities tomorrow.  Written discharge instructions were provided to the                            patient. Maren Beach, MD 06/11/2023 10:44:13 AM This report has been signed electronically.

## 2023-06-12 ENCOUNTER — Telehealth: Payer: Self-pay

## 2023-06-12 NOTE — Telephone Encounter (Signed)
  Follow up Call-     06/11/2023    9:47 AM  Call back number  Post procedure Call Back phone  # [phone]  Permission to leave phone message Yes     Patient questions:  Do you have a fever, pain , or abdominal swelling? No. Pain Score  0 *  Have you tolerated food without any problems? Yes.    Have you been able to return to your normal activities? Yes.    Do you have any questions about your discharge instructions: Diet   No. Medications  No. Follow up visit  No.  Do you have questions or concerns about your Care? No.  Actions: * If pain score is 4 or above: No action needed, pain <4.

## 2023-06-13 LAB — SURGICAL PATHOLOGY

## 2023-06-16 ENCOUNTER — Encounter: Payer: Self-pay | Admitting: Pediatrics
